# Patient Record
Sex: Female | Born: 1960 | Race: White | Hispanic: No | Marital: Single | Smoking: Never smoker
Health system: Southern US, Community
[De-identification: ages and names within clinical notes are randomized; demographics above are authoritative.]

## PROBLEM LIST (undated history)

## (undated) DIAGNOSIS — E782 Mixed hyperlipidemia: Secondary | ICD-10-CM

## (undated) DIAGNOSIS — G479 Sleep disorder, unspecified: Secondary | ICD-10-CM

## (undated) DIAGNOSIS — E1165 Type 2 diabetes mellitus with hyperglycemia: Secondary | ICD-10-CM

## (undated) DIAGNOSIS — L309 Dermatitis, unspecified: Secondary | ICD-10-CM

## (undated) DIAGNOSIS — I10 Essential (primary) hypertension: Secondary | ICD-10-CM

## (undated) DIAGNOSIS — E66813 Obesity, class 3: Secondary | ICD-10-CM

## (undated) HISTORY — DX: Mixed hyperlipidemia: E78.2

## (undated) HISTORY — DX: Dermatitis, unspecified: L30.9

## (undated) HISTORY — DX: Type 2 diabetes mellitus with hyperglycemia: E11.65

## (undated) HISTORY — DX: Obesity, class 3: E66.813

## (undated) HISTORY — DX: Essential (primary) hypertension: I10

## (undated) HISTORY — DX: Sleep disorder, unspecified: G47.9

---

## 1997-12-26 ENCOUNTER — Emergency Department (HOSPITAL_COMMUNITY): Admission: EM | Admit: 1997-12-26 | Discharge: 1997-12-26 | Payer: Self-pay | Admitting: Emergency Medicine

## 1998-07-24 ENCOUNTER — Emergency Department (HOSPITAL_COMMUNITY): Admission: EM | Admit: 1998-07-24 | Discharge: 1998-07-24 | Payer: Self-pay | Admitting: Emergency Medicine

## 1998-11-17 ENCOUNTER — Emergency Department (HOSPITAL_COMMUNITY): Admission: EM | Admit: 1998-11-17 | Discharge: 1998-11-17 | Payer: Self-pay | Admitting: Emergency Medicine

## 1998-11-17 ENCOUNTER — Encounter: Payer: Self-pay | Admitting: Emergency Medicine

## 2001-02-14 ENCOUNTER — Emergency Department (HOSPITAL_COMMUNITY): Admission: EM | Admit: 2001-02-14 | Discharge: 2001-02-15 | Payer: Self-pay | Admitting: Emergency Medicine

## 2003-10-27 ENCOUNTER — Ambulatory Visit (HOSPITAL_COMMUNITY): Admission: RE | Admit: 2003-10-27 | Discharge: 2003-10-27 | Payer: Self-pay | Admitting: Family Medicine

## 2003-10-28 ENCOUNTER — Ambulatory Visit (HOSPITAL_COMMUNITY): Admission: RE | Admit: 2003-10-28 | Discharge: 2003-10-28 | Payer: Self-pay | Admitting: Family Medicine

## 2003-11-13 ENCOUNTER — Ambulatory Visit (HOSPITAL_COMMUNITY): Admission: RE | Admit: 2003-11-13 | Discharge: 2003-11-13 | Payer: Self-pay | Admitting: Neurology

## 2003-11-27 ENCOUNTER — Other Ambulatory Visit: Admission: RE | Admit: 2003-11-27 | Discharge: 2003-11-27 | Payer: Self-pay | Admitting: Family Medicine

## 2003-12-04 ENCOUNTER — Ambulatory Visit (HOSPITAL_COMMUNITY): Admission: RE | Admit: 2003-12-04 | Discharge: 2003-12-04 | Payer: Self-pay | Admitting: Neurology

## 2003-12-24 ENCOUNTER — Encounter: Admission: RE | Admit: 2003-12-24 | Discharge: 2004-01-15 | Payer: Self-pay | Admitting: Family Medicine

## 2004-01-16 ENCOUNTER — Emergency Department (HOSPITAL_COMMUNITY): Admission: EM | Admit: 2004-01-16 | Discharge: 2004-01-17 | Payer: Self-pay | Admitting: Emergency Medicine

## 2004-01-29 ENCOUNTER — Emergency Department (HOSPITAL_COMMUNITY): Admission: EM | Admit: 2004-01-29 | Discharge: 2004-01-29 | Payer: Self-pay | Admitting: Emergency Medicine

## 2004-05-09 ENCOUNTER — Ambulatory Visit: Payer: Self-pay | Admitting: Family Medicine

## 2004-08-06 ENCOUNTER — Emergency Department (HOSPITAL_COMMUNITY): Admission: EM | Admit: 2004-08-06 | Discharge: 2004-08-06 | Payer: Self-pay | Admitting: Emergency Medicine

## 2007-07-30 ENCOUNTER — Emergency Department (HOSPITAL_COMMUNITY): Admission: EM | Admit: 2007-07-30 | Discharge: 2007-07-30 | Payer: Self-pay | Admitting: Emergency Medicine

## 2009-04-24 ENCOUNTER — Inpatient Hospital Stay (HOSPITAL_COMMUNITY): Admission: EM | Admit: 2009-04-24 | Discharge: 2009-04-26 | Payer: Self-pay | Admitting: Emergency Medicine

## 2009-04-26 ENCOUNTER — Encounter (INDEPENDENT_AMBULATORY_CARE_PROVIDER_SITE_OTHER): Payer: Self-pay | Admitting: Internal Medicine

## 2010-05-01 ENCOUNTER — Emergency Department (HOSPITAL_COMMUNITY)
Admission: EM | Admit: 2010-05-01 | Discharge: 2010-05-01 | Payer: Self-pay | Source: Home / Self Care | Admitting: Emergency Medicine

## 2010-09-28 LAB — BASIC METABOLIC PANEL
BUN: 12 mg/dL (ref 6–23)
Calcium: 8.9 mg/dL (ref 8.4–10.5)
Creatinine, Ser: 0.74 mg/dL (ref 0.4–1.2)
GFR calc non Af Amer: >60 mL/min (ref >60)
Sodium: 140 mEq/L (ref 135–145)

## 2010-09-29 LAB — URINALYSIS, ROUTINE W REFLEX MICROSCOPIC
Hgb urine dipstick: NEGATIVE
Nitrite: NEGATIVE
Specific Gravity, Urine: 1.011 (ref 1.005–1.030)
Urobilinogen, UA: 1 mg/dL (ref 0.0–1.0)

## 2010-09-29 LAB — COMPREHENSIVE METABOLIC PANEL
AST: 22 U/L (ref 0–37)
BUN: 9 mg/dL (ref 6–23)
Calcium: 8.7 mg/dL (ref 8.4–10.5)
GFR calc Af Amer: >60 mL/min (ref >60)
GFR calc non Af Amer: >60 mL/min (ref >60)
Potassium: 2.8 mEq/L — ABNORMAL LOW (ref 3.5–5.1)
Total Protein: 6.4 g/dL (ref 6.0–8.3)

## 2010-09-29 LAB — LIPID PANEL
Cholesterol: 188 mg/dL (ref 0–200)
LDL Cholesterol: 123 mg/dL — ABNORMAL HIGH (ref 0–99)
Total CHOL/HDL Ratio: 4 RATIO
VLDL: 18 mg/dL (ref 0–40)

## 2010-09-29 LAB — CARDIAC PANEL(CRET KIN+CKTOT+MB+TROPI)
CK, MB: 0.7 ng/mL (ref 0.3–4.0)
Relative Index: INVALID (ref 0.0–2.5)
Relative Index: INVALID (ref 0.0–2.5)
Relative Index: INVALID (ref 0.0–2.5)
Total CK: 62 U/L (ref 7–177)
Total CK: 89 U/L (ref 7–177)
Troponin I: <0.01 ng/mL (ref 0.00–0.06)
Troponin I: <0.01 ng/mL (ref 0.00–0.06)

## 2010-09-29 LAB — CBC
MCHC: 33.1 g/dL (ref 30.0–36.0)
MCHC: 33.3 g/dL (ref 30.0–36.0)
Platelets: 280 10*3/uL (ref 150–400)
Platelets: 332 10*3/uL (ref 150–400)
RDW: 13.6 % (ref 11.5–15.5)
RDW: 13.7 % (ref 11.5–15.5)

## 2010-09-29 LAB — POCT I-STAT, CHEM 8
BUN: 11 mg/dL (ref 6–23)
Chloride: 104 mEq/L (ref 96–112)
HCT: 42 % (ref 36.0–46.0)
Potassium: 3.3 mEq/L — ABNORMAL LOW (ref 3.5–5.1)
Sodium: 140 mEq/L (ref 135–145)
TCO2: 22 mmol/L (ref 0–100)

## 2010-09-29 LAB — BLOOD GAS, ARTERIAL
Acid-base deficit: 0.9 mmol/L (ref 0.0–2.0)
FIO2: 0.21 %
TCO2: 20.3 mmol/L (ref 0–100)
pH, Arterial: 7.429 — ABNORMAL HIGH (ref 7.350–7.400)
pO2, Arterial: 85.2 mmHg (ref 80.0–100.0)

## 2010-09-29 LAB — BASIC METABOLIC PANEL
Calcium: 9 mg/dL (ref 8.4–10.5)
GFR calc Af Amer: >60 mL/min (ref >60)
GFR calc non Af Amer: >60 mL/min (ref >60)
Sodium: 135 mEq/L (ref 135–145)

## 2010-09-29 LAB — DIFFERENTIAL
Eosinophils Relative: 3 % (ref 0–5)
Lymphocytes Relative: 35 % (ref 12–46)
Neutro Abs: 4.6 10*3/uL (ref 1.7–7.7)
Neutrophils Relative %: 55 % (ref 43–77)

## 2010-09-29 LAB — POCT CARDIAC MARKERS
CKMB, poc: <1 ng/mL — ABNORMAL LOW (ref 1.0–8.0)
Myoglobin, poc: 58.1 ng/mL (ref 12–200)
Troponin i, poc: <0.05 ng/mL (ref 0.00–0.09)

## 2010-09-29 LAB — TSH: TSH: 1.818 u[IU]/mL (ref 0.350–4.500)

## 2010-09-29 LAB — BRAIN NATRIURETIC PEPTIDE: Pro B Natriuretic peptide (BNP): <30 pg/mL (ref 0.0–100.0)

## 2010-09-29 LAB — CARBOXYHEMOGLOBIN: Total hemoglobin: 12.8 g/dL (ref 12.5–16.0)

## 2010-09-29 LAB — PROTIME-INR
INR: 0.91 (ref 0.00–1.49)
Prothrombin Time: 12.2 seconds (ref 11.6–15.2)

## 2010-09-29 LAB — SODIUM: Sodium: 137 mEq/L (ref 135–145)

## 2010-09-29 LAB — URINE CULTURE

## 2010-10-21 ENCOUNTER — Encounter (HOSPITAL_COMMUNITY)
Admission: RE | Admit: 2010-10-21 | Discharge: 2010-10-21 | Disposition: A | Discharge status: Home / Self Care | Payer: Managed Care, Other (non HMO) | Source: Ambulatory Visit | Attending: Obstetrics & Gynecology | Admitting: Obstetrics & Gynecology

## 2010-10-21 LAB — CBC
HCT: 35.4 % — ABNORMAL LOW (ref 36.0–46.0)
Hemoglobin: 11.2 g/dL — ABNORMAL LOW (ref 12.0–15.0)
MCV: 89.2 fL (ref 78.0–100.0)
Platelets: 231 10*3/uL (ref 150–400)
RBC: 3.97 MIL/uL (ref 3.87–5.11)
WBC: 4.9 10*3/uL (ref 4.0–10.5)

## 2010-10-21 LAB — COMPREHENSIVE METABOLIC PANEL
Albumin: 3.5 g/dL (ref 3.5–5.2)
Alkaline Phosphatase: 65 U/L (ref 39–117)
BUN: 10 mg/dL (ref 6–23)
Chloride: 104 mEq/L (ref 96–112)
Glucose, Bld: 108 mg/dL — ABNORMAL HIGH (ref 70–99)
Potassium: 3.7 mEq/L (ref 3.5–5.1)
Total Bilirubin: 0.4 mg/dL (ref 0.3–1.2)

## 2010-10-21 LAB — SURGICAL PCR SCREEN: MRSA, PCR: NEGATIVE

## 2010-10-28 ENCOUNTER — Ambulatory Visit (HOSPITAL_COMMUNITY)
Admission: RE | Admit: 2010-10-28 | Discharge: 2010-10-28 | Disposition: A | Discharge status: Home / Self Care | Payer: Managed Care, Other (non HMO) | Source: Ambulatory Visit | Attending: Obstetrics & Gynecology | Admitting: Obstetrics & Gynecology

## 2010-10-28 ENCOUNTER — Other Ambulatory Visit: Payer: Self-pay | Admitting: Obstetrics & Gynecology

## 2010-10-28 DIAGNOSIS — Z01818 Encounter for other preprocedural examination: Secondary | ICD-10-CM | POA: Insufficient documentation

## 2010-10-28 DIAGNOSIS — Z01812 Encounter for preprocedural laboratory examination: Secondary | ICD-10-CM | POA: Insufficient documentation

## 2010-10-28 DIAGNOSIS — N949 Unspecified condition associated with female genital organs and menstrual cycle: Secondary | ICD-10-CM | POA: Insufficient documentation

## 2010-10-28 DIAGNOSIS — N920 Excessive and frequent menstruation with regular cycle: Secondary | ICD-10-CM | POA: Insufficient documentation

## 2010-10-28 DIAGNOSIS — D259 Leiomyoma of uterus, unspecified: Secondary | ICD-10-CM | POA: Insufficient documentation

## 2010-10-28 LAB — TYPE AND SCREEN

## 2010-11-23 NOTE — Op Note (Signed)
NAMEMESHA, Sylvia Hardy                 ACCOUNT NO.:  [id number]  MEDICAL RECORD NO.:  [id number]           PATIENT TYPE:  O  LOCATION:  9306                          FACILITY:  [facility]  PHYSICIAN:  Genia Del, M.D.DATE OF BIRTH:  01-Oct-1960  DATE OF PROCEDURE:  10/28/2010 DATE OF DISCHARGE:  10/28/2010                              OPERATIVE REPORT   PREOPERATIVE DIAGNOSIS:  Menorrhagia with pelvic pains and uterine myomas.  POSTOPERATIVE DIAGNOSIS:  Menorrhagia with pelvic pains and uterine myomas.  PROCEDURE:  Total laparoscopic hysterectomy assisted with Engineer, building services robot.  SURGEON:  Genia Del, MD  ASSISTANT:  Noland Fordyce, MD and Marlinda Mike, CNM  ANESTHESIOLOGIST:  Dr. Malen Gauze and Dr. Rodman Pickle.  PROCEDURE:  Under general anesthesia with endotracheal intubation, the patient was in lithotomy position.  She was prepped with surgery prep on the abdomen and Betadine on the suprapubic vulvar and vaginal areas. The patient was draped as usual.  The vaginal exam even under general anesthesia was limited by obesity.  We inserted the weighted speculum in the vagina.  The anterior lip of the cervix was grasped with a tenaculum.  The hysterometer is at 14 cm, dilatation of the cervix with Hegar dilators up to #27 without difficulty.  We then used a #12 RUMI with a medium KOH ring.  This is inserted without any difficulty.  We removed the weighted speculum.  The bladder was catheterized with a Foley.  We then went to the abdomen.  We started with infiltrating the subcutaneous tissue at [facility].  We had a nasogastric tube with good drainage in place.  We inserted the Veress needle at that level, created pneumoperitoneum with CO2.  We then put the 5-mm trocar at that level and the 5 mm camera.  Note that the security test were done before inserting the Veress needle.  We then inspected the abdominopelvic cavities.  No adhesion was present with the anterior wall and  the abdomen.  The uterus was quite enlarged with multiple myomas, but mobile.  We used an M configuration.  We infiltrated the subcutaneous at all levels and made our incisions with a scalpel.  We have 2 robotic ports on the right side, one robotic port on the left side, and the camera at the supraumbilical level.  We inserted all ports under direct vision.  We go by laparoscopy to inserted 2 Vicryl 0 six inches long through the supraumbilical port and we parked then on the right abdominal wall.  We then docked the robot from the right side which an Endo Shear scissor in the first arm, PK in the second arm, and Cobra clamp in the third arm and went to the console.  We started on the left side.  We cauterized and sectioned the left round ligament.  We cauterized and sectioned the left tube.  The patient is status post bilateral tubal sterilization.  We assured that the proximal distended part is completely removed.  We then cauterized and sectioned the left utero-ovarian ligament and then descended along the left uterine border. We opened the visceral peritoneum anteriorly towards the midline.  We  visualized the left ureter which was in normal anatomic position.  We then went on the right side and proceeded exactly the same way.  We then cauterized on each side to reduce the backflow and then finished opening the visceral peritoneum anteriorly and descended the bladder past the KOH ring.  We then cauterized and sectioned the left uterine arteries and the right uterine artery.  The patient had good flow to her uterus, given the large uterine size with multiple fibroids.  We got good hemostasis.  We then opened the vagina circumferentially all around the top of the KOH ring.  We used the tip of the Endo Shear scissor.  We opened anteriorly first then posteriorly and then finished on each side. The uterus was completely detached.  It was put into the left gutter. We irrigated and suction and  confirmed good hemostasis on the vaginal vault as well as on both adnexa.  We used our Vicryl 0 parked on the right abdominal wall.  We started with the angles with figure-of-eights making sure to take good bites including the vaginal mucosa.  We then inserted two additional sutures of Vicryl 0 six  inches long through the supraumbilical port by removing the camera and then reinserting it right after.  One of those suture was parked on the right abdominal wall.  The other one was used to make a figure-of-eight to continue to close the vaginal vault.  We then used the left one to completely close the vaginal vault.  Hemostasis was adequate.  We therefore removed all robotic instruments and undocked the robot.  We then went by laparoscopy.  The 5-mm camera was inserted in the second robotic port and we used laparoscopic clamps to remove all 4 sutures through the supraumbilical port.  We then removed that port and inserted the Storz morcellator at that level.  The uterus was easily morcellated the weight was above 400 grams.  We assured that no piece of the uterus or myoma was left in the abdominopelvic cavity.  We irrigated and suctioned thoroughly.  Hemostasis was reverified on the vaginal vault bladder flap in all pedicles and it was adequate.  We therefore removed all instruments, removed all ports under direct vision, evacuated the CO2. We closed the supraumbilical incision with a short running stitch of Vicryl 0 at the aponeurosis.  We then reapproximated the adipose tissue with a Vicryl 4-0 and closed the skin with a subcuticular stitch of Vicryl 4-0.  Dermabond was applied on top.  We closed all other incisions with a subcuticular stitch of Vicryl 4-0 and Dermabond.  The occluder that was used in the vagina was deflated and removed.  The estimated blood loss was 125 mL.  The count of instruments and sponges was complete.  The patient received Flagyl and genta IV before induction.  No  complications occurred, and she was brought to recovery room in good stable status.     Genia Del, M.D.     ML/MEDQ  D:  10/28/2010  T:  10/29/2010  Job:  [id number]  Electronically Signed by Genia Del M.D. on 11/23/2010 05:04:17 PM

## 2011-03-17 LAB — URINALYSIS, ROUTINE W REFLEX MICROSCOPIC
Nitrite: NEGATIVE
Specific Gravity, Urine: 1.027
Urobilinogen, UA: 0.2

## 2011-03-17 LAB — URINE MICROSCOPIC-ADD ON

## 2012-06-07 IMAGING — CR DG WRIST COMPLETE 3+V*L*
4 series · 4 of 4 positions shown · non-contrast
Comparison: None.

CLINICAL DATA: Fall with left wrist pain and swelling.

LEFT WRIST - COMPLETE 3+ VIEW

[x wrist pa left]
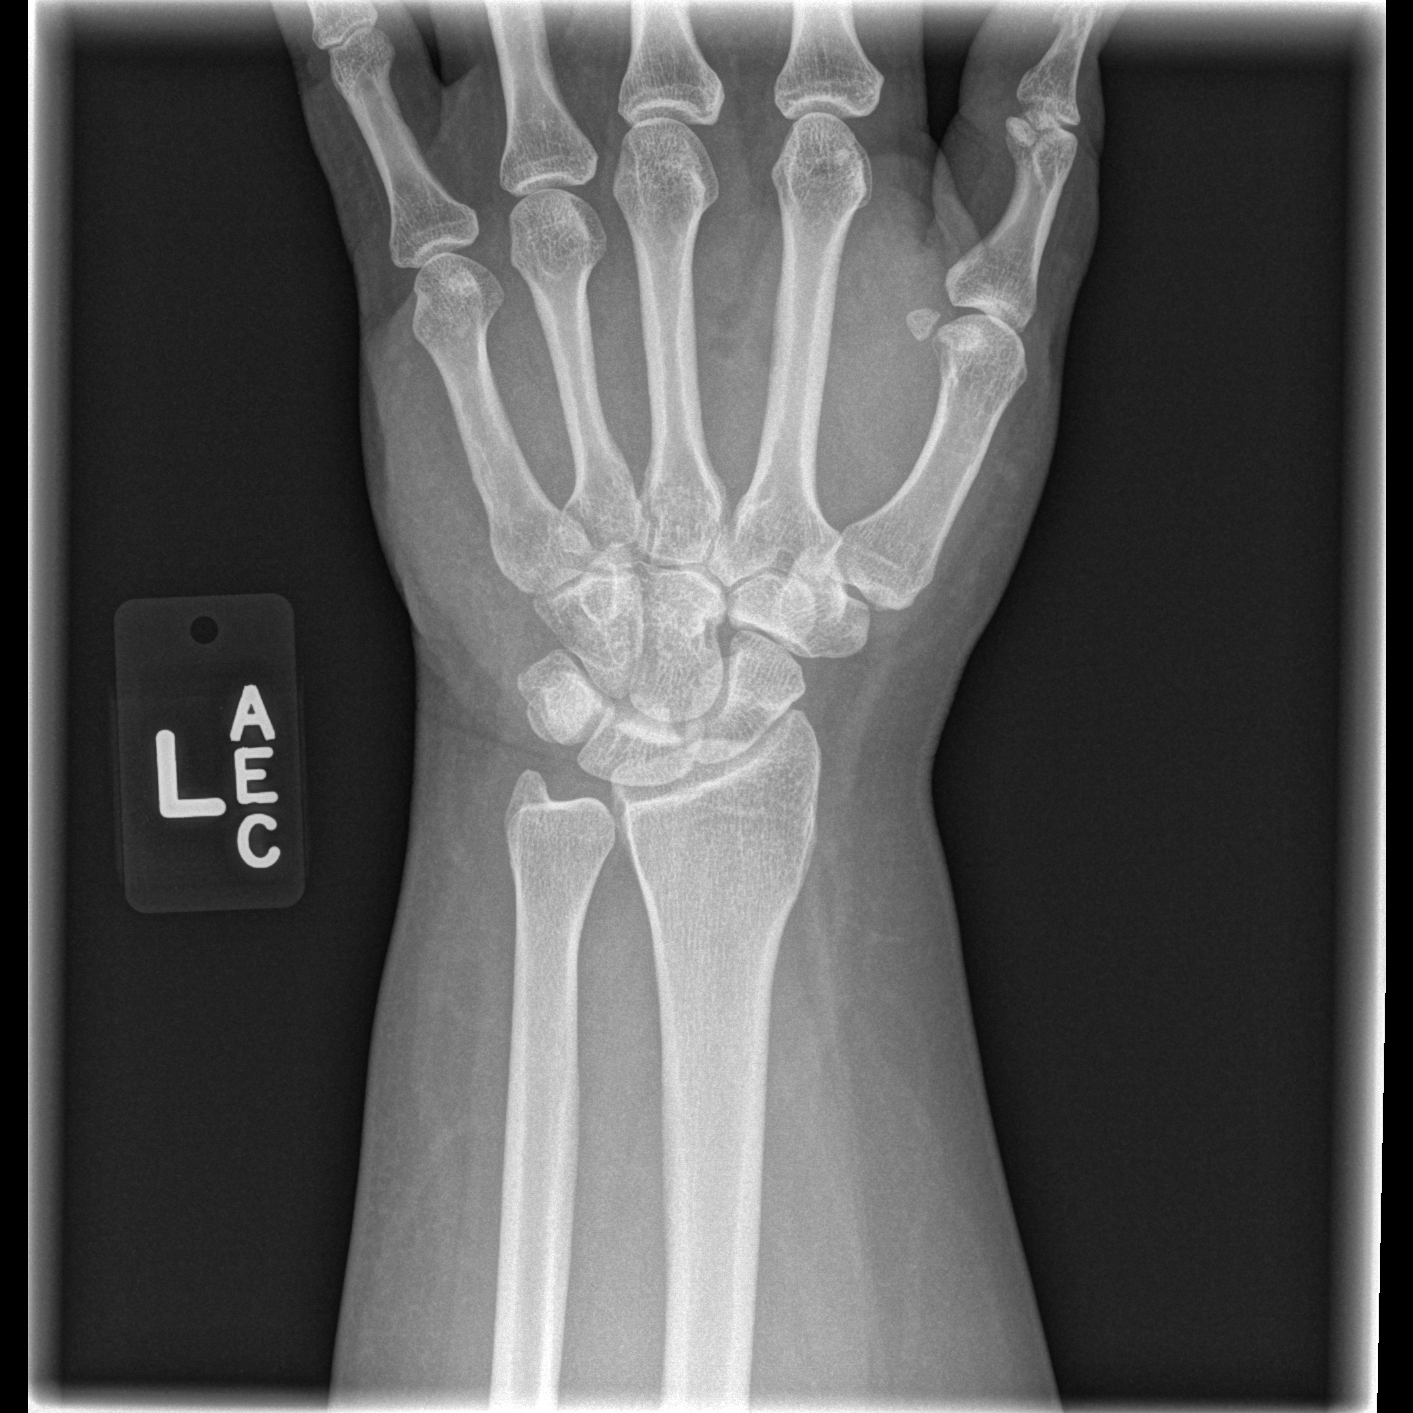

[x wrist obl left]
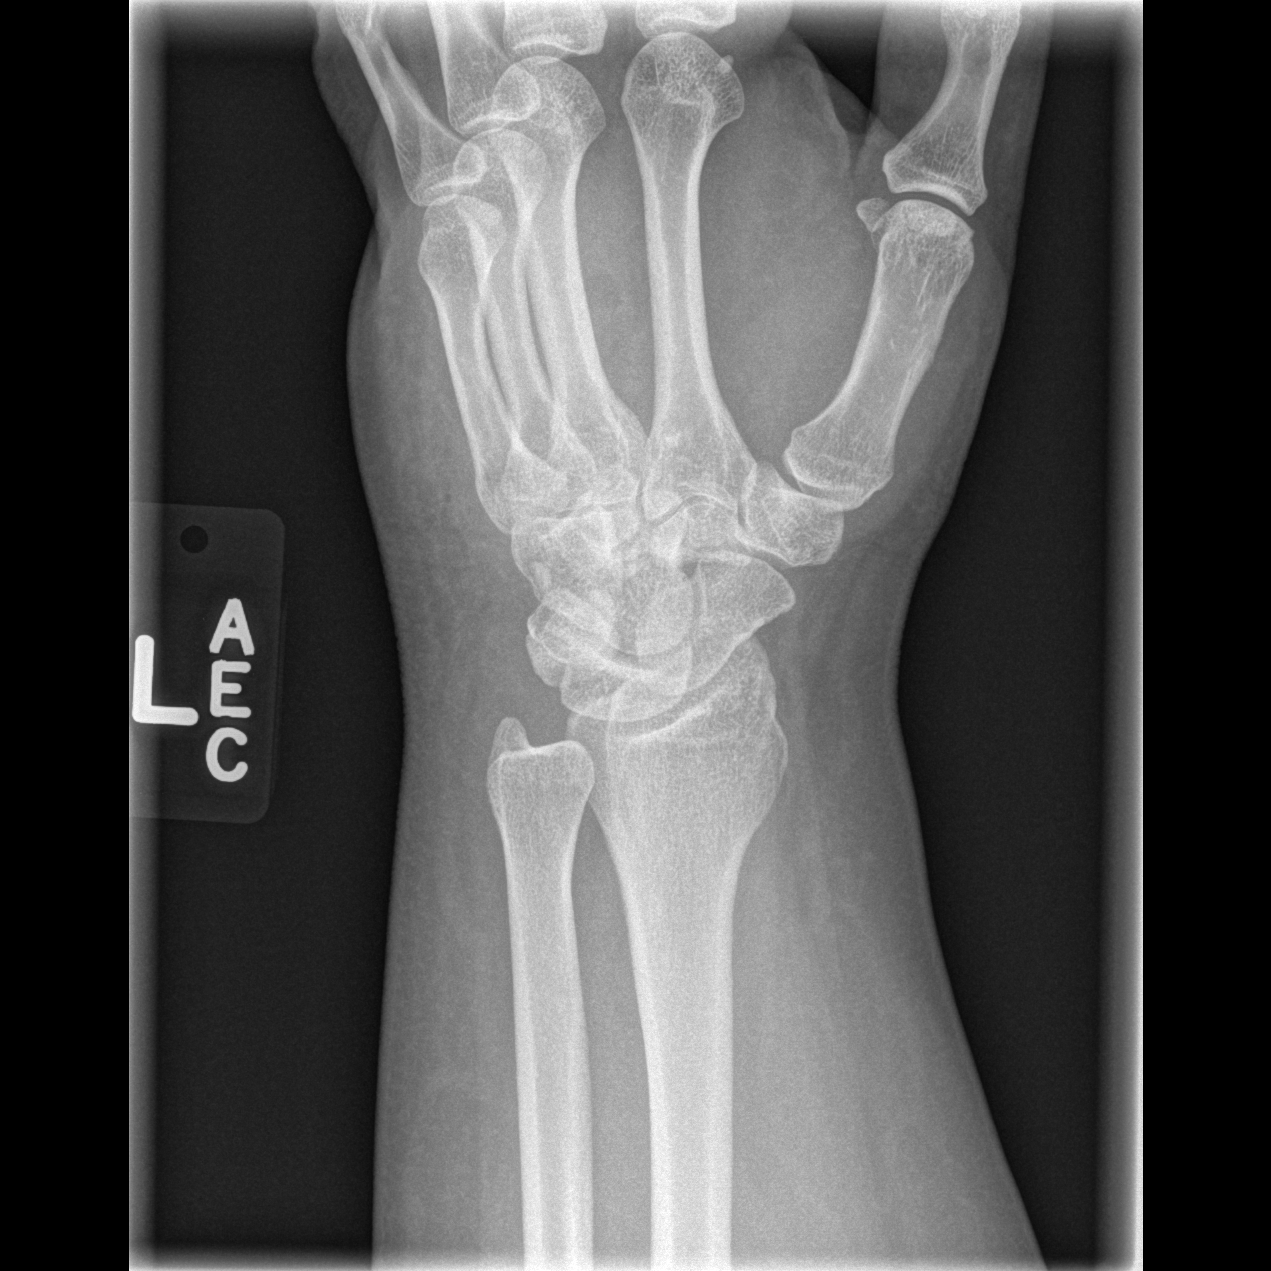

[x wrist lat left]
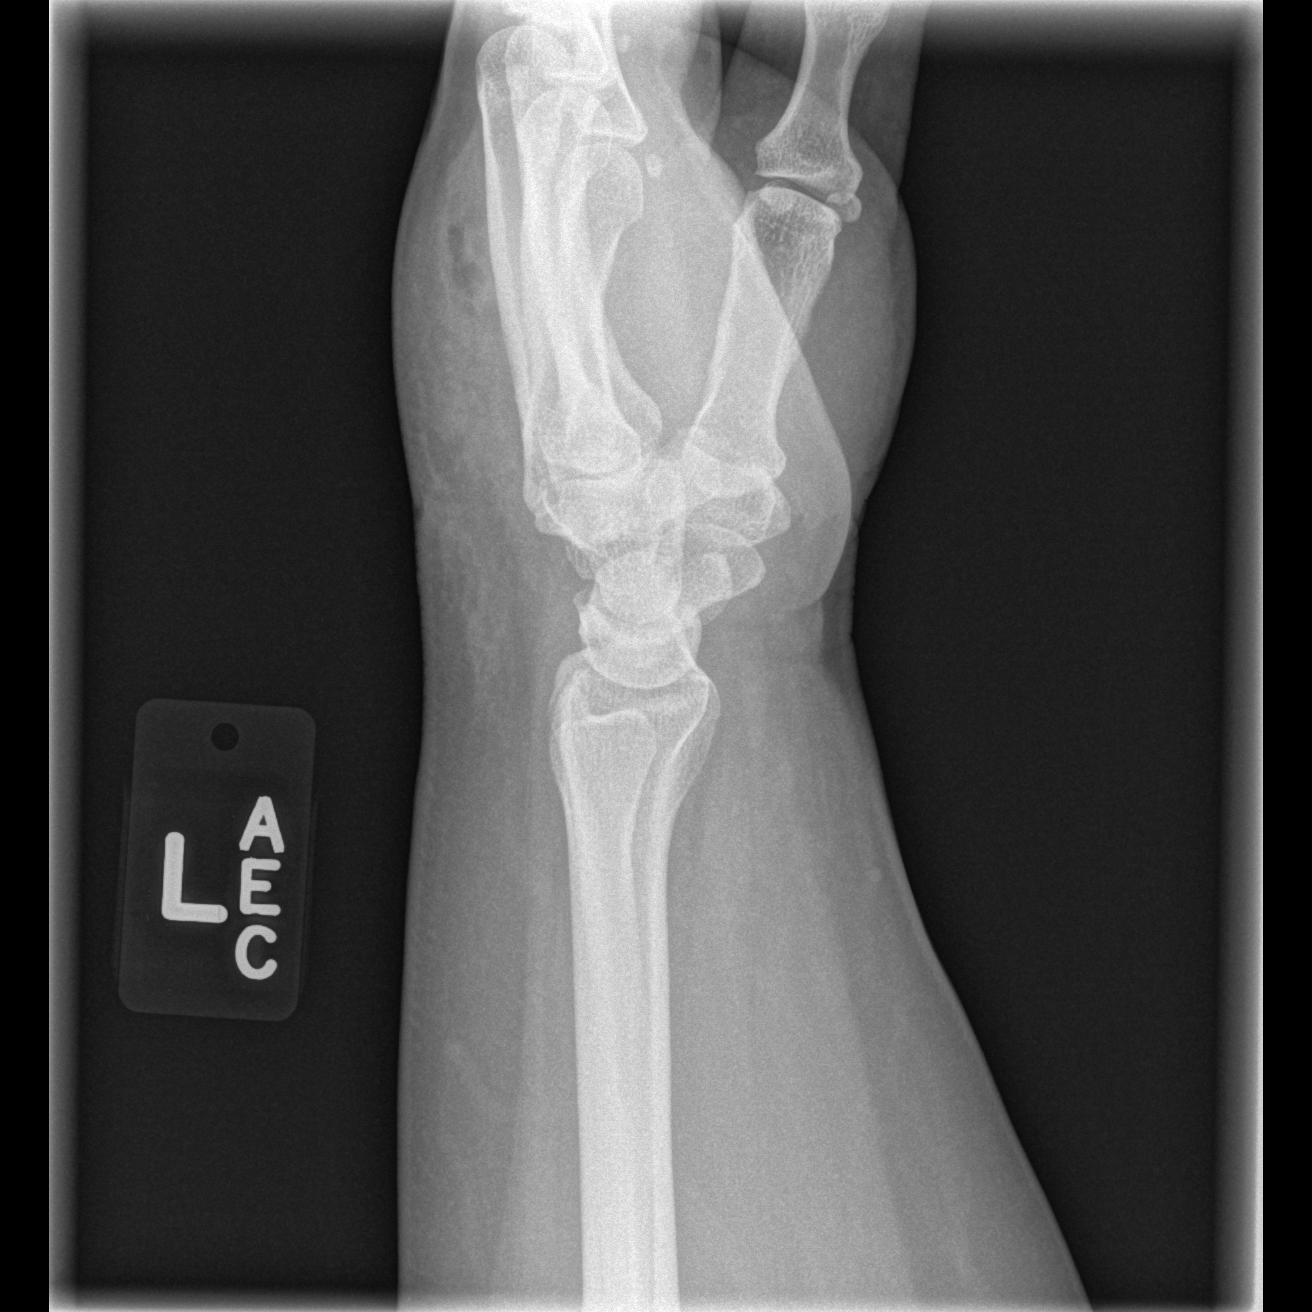

[x wrist navicular]
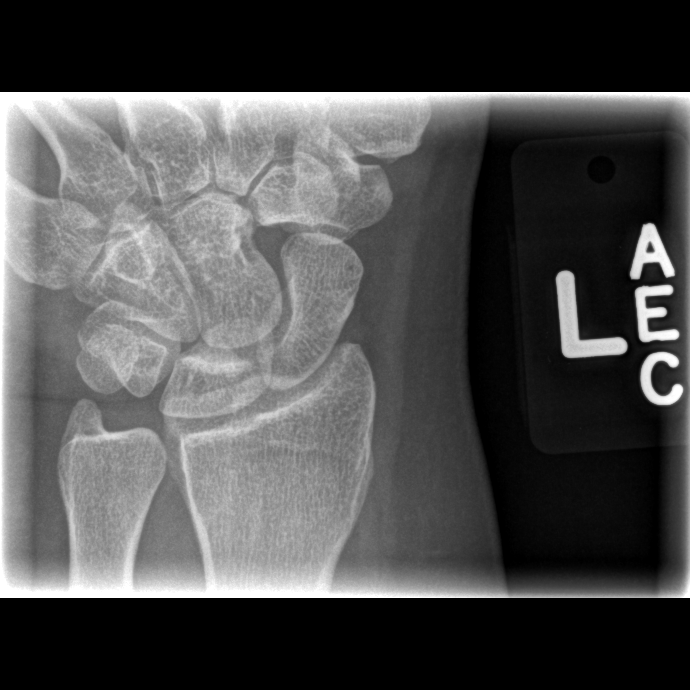

[4 of 4 positions shown; findings below may reference images not displayed]

FINDINGS: There is no evidence of fracture or dislocation.  There
is no evidence of arthropathy or other focal bone abnormality.
Soft tissues are unremarkable.
IMPRESSION: Negative.

## 2015-03-23 ENCOUNTER — Emergency Department (HOSPITAL_COMMUNITY)
Admission: EM | Admit: 2015-03-23 | Discharge: 2015-03-23 | Disposition: A | Discharge status: Home / Self Care | Payer: Managed Care, Other (non HMO) | Attending: Emergency Medicine | Admitting: Emergency Medicine

## 2015-03-23 ENCOUNTER — Encounter (HOSPITAL_COMMUNITY): Payer: Self-pay

## 2015-03-23 DIAGNOSIS — R030 Elevated blood-pressure reading, without diagnosis of hypertension: Secondary | ICD-10-CM | POA: Insufficient documentation

## 2015-03-23 DIAGNOSIS — H6093 Unspecified otitis externa, bilateral: Secondary | ICD-10-CM

## 2015-03-23 MED ORDER — NEOMYCIN-POLYMYXIN-HC 3.5-10000-1 OT SOLN: 5.0000 [drp] | Freq: Four times a day (QID) | OTIC | Status: DC

## 2015-03-23 NOTE — ED Notes (Signed)
Patient given discharge instructions, no further questions or needs. Ambulated to parking lot with no apparent distress.

## 2015-03-23 NOTE — ED Provider Notes (Signed)
CSN: [id number]     Arrival date & time 03/23/15  1448 History   First MD Initiated Contact with Patient 03/23/15 [phone]     Chief Complaint  Patient presents with  . Otalgia     Patient is a 54 y.o. female presenting with ear pain. The history is provided by the patient. No language interpreter was used.  Otalgia  Sylvia Hardy is an evaluation of otalgia. She reports bilateral ear pain, right greater than left. She has considerable associated itching bilaterally.  She uses ear buds every night and uses Q-tips as well. Her pain increased after using a Q-tip last night. She denies any fevers, swelling, numbness, vision changes, chest pain, shortness of breath. Symptoms are moderate, constant, worsening.  History reviewed. No pertinent past medical history. History reviewed. No pertinent past surgical history. History reviewed. No pertinent family history. Social History  Substance Use Topics  . Smoking status: Never Smoker   . Smokeless tobacco: None  . Alcohol Use: No   OB History    No data available     Review of Systems  HENT: Positive for ear pain.   All other systems reviewed and are negative.     Allergies  Review of patient's allergies indicates not on file.  Home Medications   Prior to Admission medications   Not on File   BP 200/105 mmHg  Pulse 73  Temp(Src) 98.3 F (36.8 C)  Resp 20  SpO2 95% Physical Exam  Constitutional: She is oriented to person, place, and time. She appears well-developed and well-nourished.  HENT:  Head: Normocephalic and atraumatic.  Left ear canal with mild erythema and edema, no exudate. No mastoid tenderness bilaterally. Right ear canal with moderate erythema and edema, slight watery discharge. TMs bilaterally without erythema or effusion.  Eyes: EOM are normal. Pupils are equal, round, and reactive to light.  Neck: Neck supple.  Cardiovascular: Normal rate and regular rhythm.   Pulmonary/Chest: Effort normal. No respiratory  distress.  Neurological: She is alert and oriented to person, place, and time. No cranial nerve deficit.  Skin: Skin is warm and dry.  Psychiatric: She has a normal mood and affect. Her behavior is normal.  Nursing note and vitals reviewed.   ED Course  Procedures (including critical care time) Labs Review Labs Reviewed - No data to display  Imaging Review No results found. I have personally reviewed and evaluated these images and lab results as part of my medical decision-making.   EKG Interpretation None      MDM   Final diagnoses:  Otitis externa, bilateral  Elevated blood pressure reading without diagnosis of hypertension    Patient here for evaluation of ear pain. Examination is consistent with otitis externa. Discussed care with drops. Discussed discontinuing the ER by diffuse. Patient does have elevated blood pressure in the emergency department. Discussed rechecking her blood pressure with outpatient follow-up. Presentation is not consistent with temporal arteritis, ACS, hypertensive urgency.  Quintella Reichert, MD 03/23/15 [phone]

## 2015-03-23 NOTE — Discharge Instructions (Signed)
Otitis Externa Otitis externa is a bacterial or fungal infection of the outer ear canal. This is the area from the eardrum to the outside of the ear. Otitis externa is sometimes called "swimmer's ear." CAUSES  Possible causes of infection include:  Swimming in dirty water.  Moisture remaining in the ear after swimming or bathing.  Mild injury (trauma) to the ear.  Objects stuck in the ear (foreign body).  Cuts or scrapes (abrasions) on the outside of the ear. SIGNS AND SYMPTOMS  The first symptom of infection is often itching in the ear canal. Later signs and symptoms may include swelling and redness of the ear canal, ear pain, and yellowish-white fluid (pus) coming from the ear. The ear pain may be worse when pulling on the earlobe. DIAGNOSIS  Your health care provider will perform a physical exam. A sample of fluid may be taken from the ear and examined for bacteria or fungi. TREATMENT  Antibiotic ear drops are often given for 10 to 14 days. Treatment may also include pain medicine or corticosteroids to reduce itching and swelling. HOME CARE INSTRUCTIONS   Apply antibiotic ear drops to the ear canal as prescribed by your health care provider.  Take medicines only as directed by your health care provider.  If you have diabetes, follow any additional treatment instructions from your health care provider.  Keep all follow-up visits as directed by your health care provider. PREVENTION   Keep your ear dry. Use the corner of a towel to absorb water out of the ear canal after swimming or bathing.  Avoid scratching or putting objects inside your ear. This can damage the ear canal or remove the protective wax that lines the canal. This makes it easier for bacteria and fungi to grow.  Avoid swimming in lakes, polluted water, or poorly chlorinated pools.  You may use ear drops made of rubbing alcohol and vinegar after swimming. Combine equal parts of white vinegar and alcohol in a bottle.  Put 3 or 4 drops into each ear after swimming. SEEK MEDICAL CARE IF:   You have a fever.  Your ear is still red, swollen, painful, or draining pus after 3 days.  Your redness, swelling, or pain gets worse.  You have a severe headache.  You have redness, swelling, pain, or tenderness in the area behind your ear. MAKE SURE YOU:   Understand these instructions.  Will watch your condition.  Will get help right away if you are not doing well or get worse. Document Released: 06/12/2005 Document Revised: 10/27/2013 Document Reviewed: 06/29/2011 [facility] Patient Information 2015 [place], [place]. This information is not intended to replace advice given to you by your health care provider. Make sure you discuss any questions you have with your health care provider.  Hypertension Hypertension, commonly called high blood pressure, is when the force of blood pumping through your arteries is too strong. Your arteries are the blood vessels that carry blood from your heart throughout your body. A blood pressure reading consists of a higher number over a lower number, such as 110/72. The higher number (systolic) is the pressure inside your arteries when your heart pumps. The lower number (diastolic) is the pressure inside your arteries when your heart relaxes. Ideally you want your blood pressure below 120/80. Hypertension forces your heart to work harder to pump blood. Your arteries may become narrow or stiff. Having hypertension puts you at risk for heart disease, stroke, and other problems.  RISK FACTORS Some risk factors for high  blood pressure are controllable. Others are not.  Risk factors you cannot control include:   Race. You may be at higher risk if you are African American.  Age. Risk increases with age.  Gender. Men are at higher risk than women before age 3 years. After age 73, women are at higher risk than men. Risk factors you can control include:  Not getting enough exercise or  physical activity.  Being overweight.  Getting too much fat, sugar, calories, or salt in your diet.  Drinking too much alcohol. SIGNS AND SYMPTOMS Hypertension does not usually cause signs or symptoms. Extremely high blood pressure (hypertensive crisis) may cause headache, anxiety, shortness of breath, and nosebleed. DIAGNOSIS  To check if you have hypertension, your health care provider will measure your blood pressure while you are seated, with your arm held at the level of your heart. It should be measured at least twice using the same arm. Certain conditions can cause a difference in blood pressure between your right and left arms. A blood pressure reading that is higher than normal on one occasion does not mean that you need treatment. If one blood pressure reading is high, ask your health care provider about having it checked again. TREATMENT  Treating high blood pressure includes making lifestyle changes and possibly taking medicine. Living a healthy lifestyle can help lower high blood pressure. You may need to change some of your habits. Lifestyle changes may include:  Following the DASH diet. This diet is high in fruits, vegetables, and whole grains. It is low in salt, red meat, and added sugars.  Getting at least 2 hours of brisk physical activity every week.  Losing weight if necessary.  Not smoking.  Limiting alcoholic beverages.  Learning ways to reduce stress. If lifestyle changes are not enough to get your blood pressure under control, your health care provider may prescribe medicine. You may need to take more than one. Work closely with your health care provider to understand the risks and benefits. HOME CARE INSTRUCTIONS  Have your blood pressure rechecked as directed by your health care provider.   Take medicines only as directed by your health care provider. Follow the directions carefully. Blood pressure medicines must be taken as prescribed. The medicine does  not work as well when you skip doses. Skipping doses also puts you at risk for problems.   Do not smoke.   Monitor your blood pressure at home as directed by your health care provider. SEEK MEDICAL CARE IF:   You think you are having a reaction to medicines taken.  You have recurrent headaches or feel dizzy.  You have swelling in your ankles.  You have trouble with your vision. SEEK IMMEDIATE MEDICAL CARE IF:  You develop a severe headache or confusion.  You have unusual weakness, numbness, or feel faint.  You have severe chest or abdominal pain.  You vomit repeatedly.  You have trouble breathing. MAKE SURE YOU:   Understand these instructions.  Will watch your condition.  Will get help right away if you are not doing well or get worse. Document Released: 06/12/2005 Document Revised: 10/27/2013 Document Reviewed: 04/04/2013 [facility] Patient Information 2015 [place], [place]. This information is not intended to replace advice given to you by your health care provider. Make sure you discuss any questions you have with your health care provider.

## 2015-03-23 NOTE — ED Notes (Signed)
Pt complains of ear pain, mainly the right side and a little on the left

## 2024-05-19 ENCOUNTER — Other Ambulatory Visit: Payer: Self-pay

## 2024-05-19 DIAGNOSIS — G479 Sleep disorder, unspecified: Secondary | ICD-10-CM | POA: Insufficient documentation

## 2024-05-19 DIAGNOSIS — E782 Mixed hyperlipidemia: Secondary | ICD-10-CM | POA: Insufficient documentation

## 2024-05-19 DIAGNOSIS — I10 Essential (primary) hypertension: Secondary | ICD-10-CM | POA: Insufficient documentation

## 2024-05-19 DIAGNOSIS — E66813 Obesity, class 3: Secondary | ICD-10-CM | POA: Insufficient documentation

## 2024-05-19 DIAGNOSIS — L309 Dermatitis, unspecified: Secondary | ICD-10-CM | POA: Insufficient documentation

## 2024-05-19 DIAGNOSIS — E1165 Type 2 diabetes mellitus with hyperglycemia: Secondary | ICD-10-CM | POA: Insufficient documentation

## 2024-05-27 ENCOUNTER — Ambulatory Visit: Admitting: Cardiology

## 2024-07-22 ENCOUNTER — Ambulatory Visit: Attending: Cardiology | Admitting: Cardiology

## 2024-07-22 ENCOUNTER — Encounter: Payer: Self-pay | Admitting: Cardiology

## 2024-07-22 DIAGNOSIS — E782 Mixed hyperlipidemia: Secondary | ICD-10-CM | POA: Insufficient documentation

## 2024-07-22 DIAGNOSIS — E66813 Obesity, class 3: Secondary | ICD-10-CM | POA: Insufficient documentation

## 2024-07-22 DIAGNOSIS — R072 Precordial pain: Secondary | ICD-10-CM | POA: Insufficient documentation

## 2024-07-22 DIAGNOSIS — I10 Essential (primary) hypertension: Secondary | ICD-10-CM | POA: Insufficient documentation

## 2024-07-22 DIAGNOSIS — G479 Sleep disorder, unspecified: Secondary | ICD-10-CM | POA: Diagnosis not present

## 2024-07-22 DIAGNOSIS — E0865 Diabetes mellitus due to underlying condition with hyperglycemia: Secondary | ICD-10-CM | POA: Diagnosis not present

## 2024-07-22 MED ORDER — METOPROLOL TARTRATE 100 MG PO TABS: 100.0000 mg | ORAL_TABLET | Freq: Once | ORAL | 0 refills | Status: AC

## 2024-07-22 MED ORDER — NITROGLYCERIN 0.4 MG SL SUBL: 0.4000 mg | SUBLINGUAL_TABLET | SUBLINGUAL | 1 refills | Status: AC | PRN

## 2024-07-22 MED ORDER — ASPIRIN 81 MG PO TBEC: 81.0000 mg | DELAYED_RELEASE_TABLET | Freq: Every day | ORAL | 3 refills | Status: AC

## 2024-07-22 NOTE — Progress Notes (Signed)
 " Cardiology Office Note:    Date:  07/22/2024   ID:  Sylvia Hardy, DOB [birth year], MRN [id number]  PCP:  Curt Conine, FNP  Cardiologist:  Jennifer JONELLE Crape, MD   Referring MD: Curt Conine, FNP    ASSESSMENT:    1. Morbid obesity (HCC)   2. Essential hypertension   3. Diabetes mellitus due to underlying condition with hyperglycemia, without long-term current use of insulin (HCC)   4. Mixed hyperlipidemia   5. Obesity, class 3 (HCC)   6. Sleep disorder    PLAN:    In order of problems listed above:  Primary prevention stressed with the patient.  Importance of compliance with diet medication stressed and patient verbalized standing. Angina pectoris: Patient's symptoms are concerning.  She has multiple risk factors for coronary artery disease.  In view of this I discussed coronary CT angiography amongst other modalities and she is agreeable.  Further recommendations will be made based on the findings of the test.  She has blood work after tomorrow at primary care and she will send us  a copy. Essential hypertension: Blood pressure is stable and diet was emphasized.  Lifestyle modification urged. Cardiac murmur: Echocardiogram will be done to assess murmur heard on auscultation. Mixed dyslipidemia: On lipid-lowering medications followed by primary care.  Goal LDL less than 60. In view of the above patient was given a prescription for sublingual nitroglycerin .  Sublingual nitroglycerin  prescription was sent, its protocol and 911 protocol explained and the patient vocalized understanding questions were answered to the patient's satisfaction.  She was advised to take a coated baby aspirin  on a daily basis. Apnea: Sleep health issues were discussed. Morbid obesity: Weight reduction stressed diet emphasized and she promises to do better.  Also diabetic education was given. Patient will be seen in follow-up appointment in 6 months or earlier if the patient has any concerns.    Medication  Adjustments/Labs and Tests Ordered: Current medicines are reviewed at length with the patient today.  Concerns regarding medicines are outlined above.  Orders Placed This Encounter  Procedures   EKG 12-Lead   No orders of the defined types were placed in this encounter.    History of Present Illness:    Sylvia Hardy is a 64 y.o. female who is being seen today for the evaluation of chest pain and dyspnea on exertion at the request of Curt Conine, FNP.  Patient is a pleasant 63 year old female.  Patient has past medical history of essential hypertension dyslipidemia diabetes mellitus and morbid obesity.  She has history of sleep apnea.  She mentions to me that she has to climb up stairs to take her groceries up she lives on the 2nd or 3rd floor I am not sure.  But she tells me whenever she walks about the grocery she has fast heartbeat and some tightness in the chest.  No orthopnea or PND no radiation to the neck or to the arms.  For this reason she was concerned and sent here for evaluation.  She is a non-smoker.  At the time of my evaluation, the patient is alert awake oriented and in no distress.  She leads a sedentary lifestyle.  Past Medical History:  Diagnosis Date   Diabetes mellitus with hyperglycemia (HCC)    Eczema    Essential hypertension    Mixed hyperlipidemia    Obesity, class 3 (HCC)    Sleep disorder     History reviewed. No pertinent surgical history.  Current Medications: Active Medications[1]  Allergies:   Cyclobenzaprine and Penicillins   Social History   Socioeconomic History   Marital status: Single    Spouse name: Not on file   Number of children: Not on file   Years of education: Not on file   Highest education level: Not on file  Occupational History   Not on file  Tobacco Use   Smoking status: Never   Smokeless tobacco: Not on file  Substance and Sexual Activity   Alcohol use: No   Drug use: Not on file   Sexual activity: Not on file  Other  Topics Concern   Not on file  Social History Narrative   Not on file   Social Drivers of Health   Tobacco Use: Unknown (07/22/2024)   Patient History    Smoking Tobacco Use: Never    Smokeless Tobacco Use: Unknown    Passive Exposure: Not on file  Financial Resource Strain: Not on file  Food Insecurity: Not on file  Transportation Needs: Not on file  Physical Activity: Not on file  Stress: Not on file  Social Connections: Not on file  Depression (EYV7-0): Not on file  Alcohol Screen: Not on file  Housing: Not on file  Utilities: Not on file  Health Literacy: Not on file     Family History: The patient's family history includes Heart disease in her mother; Hypertension in her father and mother.  ROS:   Please see the history of present illness.    All other systems reviewed and are negative.  EKGs/Labs/Other Studies Reviewed:    The following studies were reviewed today:  EKG Interpretation Date/Time:  Tuesday July 22 2024 14:04:02 EST Ventricular Rate:  89 PR Interval:  148 QRS Duration:  90 QT Interval:  374 QTC Calculation: 455 R Axis:   -47  Text Interpretation: Normal sinus rhythm Left anterior fascicular block Minimal voltage criteria for LVH, may be normal variant ( Cornell product ) When compared with ECG of 24-Apr-2009 01:01, Nonspecific T wave abnormality no longer evident in Inferior leads Nonspecific T wave abnormality no longer evident in Anterior leads Confirmed by Edwyna Backers [phone]) on 07/22/2024 2:11:58 PM     Recent Labs: No results found for requested labs within last 365 days.  Recent Lipid Panel    Component Value Date/Time   CHOL  04/24/2009 0505    188        ATP III CLASSIFICATION:  <200     mg/dL   Desirable  799-760  mg/dL   Borderline High  >=759    mg/dL   High          TRIG 91 04/24/2009 0505   HDL 47 04/24/2009 0505   CHOLHDL 4.0 04/24/2009 0505   VLDL 18 04/24/2009 0505   LDLCALC (H) 04/24/2009 0505    123         Total Cholesterol/HDL:CHD Risk Coronary Heart Disease Risk Table                     Men   Women  1/2 Average Risk   3.4   3.3  Average Risk       5.0   4.4  2 X Average Risk   9.6   7.1  3 X Average Risk  23.4   11.0        Use the calculated Patient Ratio above and the CHD Risk Table to determine the patient's CHD Risk.        ATP III  CLASSIFICATION (LDL):  <100     mg/dL   Optimal  899-870  mg/dL   Near or Above                    Optimal  130-159  mg/dL   Borderline  839-810  mg/dL   High  >809     mg/dL   Very High    Physical Exam:    VS:  BP 120/66   Pulse 89   Ht 5' 4 (1.626 m)   Wt 282 lb 6.4 oz (128.1 kg)   SpO2 96%   BMI 48.47 kg/m     Wt Readings from Last 3 Encounters:  07/22/24 282 lb 6.4 oz (128.1 kg)     GEN: Patient is in no acute distress HEENT: Normal NECK: No JVD; No carotid bruits LYMPHATICS: No lymphadenopathy CARDIAC: S1 S2 regular, 2/6 systolic murmur at the apex. RESPIRATORY:  Clear to auscultation without rales, wheezing or rhonchi  ABDOMEN: Soft, non-tender, non-distended MUSCULOSKELETAL:  No edema; No deformity  SKIN: Warm and dry NEUROLOGIC:  Alert and oriented x 3 PSYCHIATRIC:  Normal affect    Signed, Jennifer JONELLE Crape, MD  07/22/2024 2:12 PM    [facility]      [1]  Current Meds  Medication Sig   amLODipine (NORVASC) 5 MG tablet Take 5 mg by mouth daily.   atorvastatin (LIPITOR) 10 MG tablet Take 10 mg by mouth daily.   loratadine (CLARITIN) 10 MG tablet Take 10 mg by mouth daily.   metFORMIN (GLUCOPHAGE) 500 MG tablet Take 500 mg by mouth 2 (two) times daily.   "

## 2024-07-22 NOTE — Patient Instructions (Addendum)
 Medication Instructions:  Your physician has recommended you make the following change in your medication:   START: Aspirin  81 mg daily START: Nitroglycerin  0.4 mg under the tongue every 5 minutes x 3 doses as needed for chest pain.  *If you need a refill on your cardiac medications before your next appointment, please call your pharmacy*  Lab Work: Your physician recommends that you return for lab work in:   Labs today: BMP  If you have labs (blood work) drawn today and your tests are completely normal, you will receive your results only by: MyChart Message (if you have MyChart) OR A paper copy in the mail If you have any lab test that is abnormal or we need to change your treatment, we will call you to review the results.  Testing/Procedures: Your physician has requested that you have an echocardiogram. Echocardiography is a painless test that uses sound waves to create images of your heart. It provides your doctor with information about the size and shape of your heart and how well your hearts chambers and valves are working. This procedure takes approximately one hour. There are no restrictions for this procedure. Please do NOT wear cologne, perfume, aftershave, or lotions (deodorant is allowed). Please arrive 15 minutes prior to your appointment time.  Please note: We ask at that you not bring children with you during ultrasound (echo/ vascular) testing. Due to room size and safety concerns, children are not allowed in the ultrasound rooms during exams. Our front office staff cannot provide observation of children in our lobby area while testing is being conducted. An adult accompanying a patient to their appointment will only be allowed in the ultrasound room at the discretion of the ultrasound technician under special circumstances. We apologize for any inconvenience.    Your cardiac CT will be scheduled at one of the below locations:   [facility] [address] [place], [place] [address] [phone] (Severe contrast allergies only)  OR   [facility] [address] [place], [place] [address] [phone]  OR   [facility] [address] [place], [place] [address] [phone]  OR   [facility] and Vascular Tower [address]  [place], [place] [address]  OR   [facility] [address] [place], [place] [phone]  If scheduled at [facility], please arrive at the [facility] and Children's Entrance (Entrance C2) of [facility] 30 minutes prior to test start time. You can use the FREE valet parking offered at entrance C (encouraged to control the heart rate for the test)  Proceed to the [facility] Radiology Department (first floor) to check-in and test prep.  All radiology patients and guests should use entrance C2 at [facility], accessed from [facility], even though the hospital's physical address listed is [address].  If scheduled at the Heart and Vascular Tower at [address], please enter the parking lot using the [address] entrance and use the FREE valet service at the patient drop-off area. Enter the building and check-in with registration on the main floor.  If scheduled at [facility], please arrive to the Heart and Vascular Center 15 mins early for check-in and test prep.  There is spacious parking and easy access to the radiology department from the [facility] Heart and Vascular entrance. Please enter here and check-in with the desk attendant.   If scheduled at [facility],  please arrive 30 minutes early for check-in and test prep.  Please follow these instructions carefully (unless otherwise directed):  An IV will be required for this test and Nitroglycerin  will be given.  Hold all erectile dysfunction medications at least 3 days (72 hrs) prior to test. (Ie viagra, cialis,  sildenafil, tadalafil, etc)   On the Night Before the Test: Be sure to Drink plenty of water. Do not consume any caffeinated/decaffeinated beverages or chocolate 12 hours prior to your test. Do not take any antihistamines 12 hours prior to your test.  On the Day of the Test: Drink plenty of water until 1 hour prior to the test. Do not eat any food 1 hour prior to test. You may take your regular medications prior to the test.  Take metoprolol  (Lopressor ) two hours prior to test. Patients who wear a continuous glucose monitor MUST remove the device prior to scanning. FEMALES- please wear underwire-free bra if available, avoid dresses & tight clothing      After the Test: Drink plenty of water. After receiving IV contrast, you may experience a mild flushed feeling. This is normal. On occasion, you may experience a mild rash up to 24 hours after the test. This is not dangerous. If this occurs, you can take Benadryl 25 mg, Zyrtec, Claritin, or Allegra and increase your fluid intake. (Patients taking Tikosyn should avoid Benadryl, and may take Zyrtec, Claritin, or Allegra) If you experience trouble breathing, this can be serious. If it is severe call 911 IMMEDIATELY. If it is mild, please call our office.  We will call to schedule your test 2-4 weeks out understanding that some insurance companies will need an authorization prior to the service being performed.   For more information and frequently asked questions, please visit our website : [email or web address]  For non-scheduling related questions, please contact the cardiac imaging nurse navigator should you have any questions/concerns: Cardiac Imaging Nurse Navigators Direct Office Dial: [phone]   For scheduling needs, including cancellations and rescheduling, please call Brittany, [phone].   Follow-Up: At [facility], you and your health needs are our priority.  As part of our continuing mission to  provide you with exceptional heart care, our providers are all part of one team.  This team includes your primary Cardiologist (physician) and Advanced Practice Providers or APPs (Physician Assistants and Nurse Practitioners) who all work together to provide you with the care you need, when you need it.  Your next appointment:   6 month(s)  Provider:   Jennifer Crape, MD    We recommend signing up for the patient portal called MyChart.  Sign up information is provided on this After Visit Summary.  MyChart is used to connect with patients for Virtual Visits (Telemedicine).  Patients are able to view lab/test results, encounter notes, upcoming appointments, etc.  Non-urgent messages can be sent to your provider as well.   To learn more about what you can do with MyChart, go to [email or web address].   Other Instructions None

## 2024-07-23 ENCOUNTER — Ambulatory Visit: Payer: Self-pay | Admitting: Cardiology

## 2024-07-23 LAB — BASIC METABOLIC PANEL WITH GFR
BUN/Creatinine Ratio: 15 (ref 12–28)
BUN: 15 mg/dL (ref 8–27)
CO2: 21 mmol/L (ref 20–29)
Calcium: 9.5 mg/dL (ref 8.7–10.3)
Chloride: 106 mmol/L (ref 96–106)
Creatinine, Ser: 1.03 mg/dL — ABNORMAL HIGH (ref 0.57–1.00)
Glucose: 132 mg/dL — ABNORMAL HIGH (ref 70–99)
Potassium: 4.3 mmol/L (ref 3.5–5.2)
Sodium: 143 mmol/L (ref 134–144)
eGFR: 61 mL/min/{1.73_m2}

## 2024-07-24 ENCOUNTER — Other Ambulatory Visit: Payer: Self-pay | Admitting: Cardiology

## 2024-07-24 DIAGNOSIS — E66813 Obesity, class 3: Secondary | ICD-10-CM

## 2024-07-24 DIAGNOSIS — I10 Essential (primary) hypertension: Secondary | ICD-10-CM

## 2024-07-24 DIAGNOSIS — E782 Mixed hyperlipidemia: Secondary | ICD-10-CM

## 2024-07-24 DIAGNOSIS — E0865 Diabetes mellitus due to underlying condition with hyperglycemia: Secondary | ICD-10-CM

## 2024-07-24 DIAGNOSIS — R072 Precordial pain: Secondary | ICD-10-CM

## 2024-07-24 DIAGNOSIS — G479 Sleep disorder, unspecified: Secondary | ICD-10-CM

## 2024-07-24 NOTE — Telephone Encounter (Signed)
 Patient is returning call

## 2024-08-05 ENCOUNTER — Ambulatory Visit (HOSPITAL_BASED_OUTPATIENT_CLINIC_OR_DEPARTMENT_OTHER)

## 2024-08-13 ENCOUNTER — Ambulatory Visit (HOSPITAL_BASED_OUTPATIENT_CLINIC_OR_DEPARTMENT_OTHER)
